# Patient Record
Sex: Female | Born: 1990 | Hispanic: No | Marital: Single | Smoking: Never smoker
Health system: Southern US, Community
[De-identification: ages and names within clinical notes are randomized; demographics above are authoritative.]

## PROBLEM LIST (undated history)

## (undated) DIAGNOSIS — Q780 Osteogenesis imperfecta: Secondary | ICD-10-CM

---

## 2011-07-24 DIAGNOSIS — Z3490 Encounter for supervision of normal pregnancy, unspecified, unspecified trimester: Secondary | ICD-10-CM | POA: Insufficient documentation

## 2011-09-06 DIAGNOSIS — Z3009 Encounter for other general counseling and advice on contraception: Secondary | ICD-10-CM | POA: Insufficient documentation

## 2017-01-06 ENCOUNTER — Emergency Department (HOSPITAL_COMMUNITY): Payer: Commercial Managed Care - HMO

## 2017-01-06 ENCOUNTER — Encounter (HOSPITAL_COMMUNITY): Payer: Self-pay

## 2017-01-06 ENCOUNTER — Emergency Department (HOSPITAL_COMMUNITY)
Admission: EM | Admit: 2017-01-06 | Discharge: 2017-01-06 | Disposition: A | Discharge status: Home / Self Care | Payer: Commercial Managed Care - HMO | Attending: Emergency Medicine | Admitting: Emergency Medicine

## 2017-01-06 DIAGNOSIS — M25511 Pain in right shoulder: Secondary | ICD-10-CM | POA: Insufficient documentation

## 2017-01-06 DIAGNOSIS — R935 Abnormal findings on diagnostic imaging of other abdominal regions, including retroperitoneum: Secondary | ICD-10-CM | POA: Insufficient documentation

## 2017-01-06 DIAGNOSIS — M545 Low back pain: Secondary | ICD-10-CM | POA: Insufficient documentation

## 2017-01-06 DIAGNOSIS — S199XXA Unspecified injury of neck, initial encounter: Secondary | ICD-10-CM | POA: Diagnosis present

## 2017-01-06 DIAGNOSIS — Y939 Activity, unspecified: Secondary | ICD-10-CM | POA: Diagnosis not present

## 2017-01-06 DIAGNOSIS — R930 Abnormal findings on diagnostic imaging of skull and head, not elsewhere classified: Secondary | ICD-10-CM | POA: Insufficient documentation

## 2017-01-06 DIAGNOSIS — R918 Other nonspecific abnormal finding of lung field: Secondary | ICD-10-CM | POA: Insufficient documentation

## 2017-01-06 DIAGNOSIS — S161XXA Strain of muscle, fascia and tendon at neck level, initial encounter: Secondary | ICD-10-CM | POA: Diagnosis not present

## 2017-01-06 DIAGNOSIS — Y92410 Unspecified street and highway as the place of occurrence of the external cause: Secondary | ICD-10-CM | POA: Diagnosis not present

## 2017-01-06 DIAGNOSIS — Y999 Unspecified external cause status: Secondary | ICD-10-CM | POA: Diagnosis not present

## 2017-01-06 DIAGNOSIS — M79601 Pain in right arm: Secondary | ICD-10-CM | POA: Insufficient documentation

## 2017-01-06 DIAGNOSIS — Z79899 Other long term (current) drug therapy: Secondary | ICD-10-CM | POA: Insufficient documentation

## 2017-01-06 DIAGNOSIS — R52 Pain, unspecified: Secondary | ICD-10-CM

## 2017-01-06 HISTORY — DX: Osteogenesis imperfecta: Q78.0

## 2017-01-06 LAB — BASIC METABOLIC PANEL
Anion gap: 10 (ref 5–15)
BUN: 9 mg/dL (ref 6–20)
CALCIUM: 8.5 mg/dL — AB (ref 8.9–10.3)
CHLORIDE: 103 mmol/L (ref 101–111)
CO2: 22 mmol/L (ref 22–32)
CREATININE: 0.69 mg/dL (ref 0.44–1.00)
GFR calc Af Amer: >60 mL/min (ref >60)
GFR calc non Af Amer: >60 mL/min (ref >60)
Glucose, Bld: 103 mg/dL — ABNORMAL HIGH (ref 65–99)
Potassium: 3.6 mmol/L (ref 3.5–5.1)
SODIUM: 135 mmol/L (ref 135–145)

## 2017-01-06 LAB — CBC
HCT: 36.7 % (ref 36.0–46.0)
HEMOGLOBIN: 12.7 g/dL (ref 12.0–15.0)
MCH: 30.8 pg (ref 26.0–34.0)
MCHC: 34.6 g/dL (ref 30.0–36.0)
MCV: 89.1 fL (ref 78.0–100.0)
Platelets: 344 10*3/uL (ref 150–400)
RBC: 4.12 MIL/uL (ref 3.87–5.11)
RDW: 12.9 % (ref 11.5–15.5)
WBC: 10.1 10*3/uL (ref 4.0–10.5)

## 2017-01-06 LAB — I-STAT CHEM 8, ED
BUN: 11 mg/dL (ref 6–20)
Calcium, Ion: 1.02 mmol/L — ABNORMAL LOW (ref 1.15–1.40)
Chloride: 106 mmol/L (ref 101–111)
Creatinine, Ser: 0.6 mg/dL (ref 0.44–1.00)
GLUCOSE: 105 mg/dL — AB (ref 65–99)
HEMATOCRIT: 37 % (ref 36.0–46.0)
Hemoglobin: 12.6 g/dL (ref 12.0–15.0)
Potassium: 6.9 mmol/L (ref 3.5–5.1)
Sodium: 136 mmol/L (ref 135–145)
TCO2: 25 mmol/L (ref 0–100)

## 2017-01-06 LAB — I-STAT BETA HCG BLOOD, ED (MC, WL, AP ONLY)

## 2017-01-06 MED ORDER — NAPROXEN 500 MG PO TABS: 500.0000 mg | ORAL_TABLET | Freq: Two times a day (BID) | ORAL | 0 refills | Status: DC

## 2017-01-06 MED ORDER — IOPAMIDOL (ISOVUE-300) INJECTION 61%
100.0000 mL | Freq: Once | INTRAVENOUS | Status: AC | PRN
Start: 2017-01-06 — End: 2017-01-06
  Administered 2017-01-06: 100 mL via INTRAVENOUS

## 2017-01-06 MED ORDER — IOPAMIDOL (ISOVUE-300) INJECTION 61%
INTRAVENOUS | Status: AC
  Administered 2017-01-06: 100 mL via INTRAVENOUS
  Filled 2017-01-06: qty 100

## 2017-01-06 MED ORDER — HYDROCODONE-ACETAMINOPHEN 5-325 MG PO TABS
2.0000 | ORAL_TABLET | Freq: Once | ORAL | Status: AC
  Administered 2017-01-06: 2 via ORAL
  Filled 2017-01-06: qty 2

## 2017-01-06 MED ORDER — METHOCARBAMOL 500 MG PO TABS: 500.0000 mg | ORAL_TABLET | Freq: Two times a day (BID) | ORAL | 0 refills | Status: DC

## 2017-01-06 MED ORDER — HYDROCODONE-ACETAMINOPHEN 5-325 MG PO TABS: 1.0000 | ORAL_TABLET | ORAL | 0 refills | Status: AC | PRN
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
## 2017-01-06 NOTE — ED Notes (Signed)
Writer notified PA of abnormal I-stat chem 8 result 

## 2017-01-06 NOTE — Discharge Instructions (Signed)
1. Medications: robaxin, naproxyn, vicodin, usual home medications 2. Treatment: rest, drink plenty of fluids, gentle stretching as discussed, alternate ice and heat 3. Follow Up: Please followup with your primary doctor in 3 days for discussion of your diagnoses and further evaluation after today's visit; if you do not have a primary care doctor use the resource guide provided to find one;  Return to the ER for worsening back pain, difficulty walking, loss of bowel or bladder control or other concerning symptoms    

## 2021-01-27 ENCOUNTER — Encounter (HOSPITAL_COMMUNITY): Payer: Self-pay | Admitting: Emergency Medicine

## 2021-01-27 ENCOUNTER — Other Ambulatory Visit: Payer: Self-pay

## 2021-01-27 ENCOUNTER — Emergency Department (HOSPITAL_COMMUNITY)
Admission: EM | Admit: 2021-01-27 | Discharge: 2021-01-27 | Disposition: A | Discharge status: Home / Self Care | Payer: Medicaid - Out of State | Attending: Emergency Medicine | Admitting: Emergency Medicine

## 2021-01-27 ENCOUNTER — Emergency Department (HOSPITAL_COMMUNITY): Payer: Medicaid - Out of State

## 2021-01-27 DIAGNOSIS — Y92511 Restaurant or cafe as the place of occurrence of the external cause: Secondary | ICD-10-CM | POA: Insufficient documentation

## 2021-01-27 DIAGNOSIS — X501XXA Overexertion from prolonged static or awkward postures, initial encounter: Secondary | ICD-10-CM | POA: Insufficient documentation

## 2021-01-27 DIAGNOSIS — S99922A Unspecified injury of left foot, initial encounter: Secondary | ICD-10-CM | POA: Diagnosis present

## 2021-01-27 DIAGNOSIS — Y990 Civilian activity done for income or pay: Secondary | ICD-10-CM | POA: Diagnosis not present

## 2021-01-27 DIAGNOSIS — S93602A Unspecified sprain of left foot, initial encounter: Secondary | ICD-10-CM | POA: Insufficient documentation

## 2021-01-27 NOTE — Discharge Instructions (Signed)
You are seen in the ER today for your foot pain.  Your physical exam and x-rays are very reassuring.  There is no sign of fracture or dislocation on your x-ray.  You were placed in a supportive brace and given crutches.  Below is contact information for Dr. Susa Simmonds, orthopedic provider on-call.  Please call his office to schedule follow-up appointment.  Return to ER if you develop any new numbness, tingling, weakness in your foot, or any other new severe symptoms.

## 2021-01-27 NOTE — ED Triage Notes (Signed)
Patient reports left foot pain, swelling, numbness and tingling x2 months. She states she works at [employer] and stands/walks at work for 10-12 hour shifts/day. She denies previous injury. She has a history of osteogenesis imperfecta. She denies any previous fractures in the left foot.

## 2021-01-27 NOTE — ED Provider Notes (Signed)
[facility]-EMERGENCY DEPT Provider Note   CSN: [id number] Arrival date & time: 01/27/21  1248     History Chief Complaint  Patient presents with  . Foot Pain    Sharon Perkins is a 30 y.o. female with history of osteogenesis imperfecta presents with concern for 2 months of intermittent left foot pain, swelling, and tingling sensations.  Patient states she never had issues with this when she worked at her prior job, recently started working at [employer] and now stands on her feet 10 to 12 hours a day.  She states pain is severe at the end of the day, and exacerbated with weightbearing.  She denies any numbness or weakness in the leg.  Patient was previously managed with orthopedic care in [facility], recently relocated to the area and is desiring to establish care with an orthopedic provider here.  HPI     Past Medical History:  Diagnosis Date  . Osteogenesis imperfecta     There are no problems to display for this patient.   History reviewed. No pertinent surgical history.   OB History   No obstetric history on file.     History reviewed. No pertinent family history.  Social History   Tobacco Use  . Smoking status: Never Smoker  . Smokeless tobacco: Never Used    Home Medications Prior to Admission medications   Medication Sig Start Date End Date Taking? Authorizing Provider  HYDROcodone-acetaminophen (NORCO/VICODIN) 5-325 MG tablet Take 1-2 tablets by mouth every 4 (four) hours as needed. 01/06/17   Muthersbaugh, Dahlia Client, PA-C  methocarbamol (ROBAXIN) 500 MG tablet Take 1 tablet (500 mg total) by mouth 2 (two) times daily. 01/06/17   Muthersbaugh, Dahlia Client, PA-C  naproxen (NAPROSYN) 500 MG tablet Take 1 tablet (500 mg total) by mouth 2 (two) times daily with a meal. 01/06/17   Muthersbaugh, Dahlia Client, PA-C    Allergies    Ampicillin  Review of Systems   Review of Systems  Constitutional: Negative.   HENT: Negative.   Respiratory: Negative.    Cardiovascular: Negative.   Gastrointestinal: Negative.   Genitourinary: Negative.   Musculoskeletal: Positive for arthralgias and joint swelling.  Neurological: Negative.     Physical Exam Updated Vital Signs BP 119/68 (BP Location: Right Arm)   Pulse 81   Temp 98.5 F (36.9 C) (Oral)   Resp 18   LMP 01/02/2021   SpO2 98%   Physical Exam Vitals and nursing note reviewed.  Constitutional:      General: She is not in acute distress.    Appearance: She is obese. She is not toxic-appearing.  HENT:     Head: Normocephalic and atraumatic.     Mouth/Throat:     Mouth: Mucous membranes are moist.     Pharynx: No oropharyngeal exudate or posterior oropharyngeal erythema.  Eyes:     General:        Right eye: No discharge.        Left eye: No discharge.     Extraocular Movements: Extraocular movements intact.     Conjunctiva/sclera: Conjunctivae normal.     Pupils: Pupils are equal, round, and reactive to light.  Cardiovascular:     Rate and Rhythm: Normal rate and regular rhythm.     Pulses: Normal pulses.     Heart sounds: Normal heart sounds. No murmur heard.   Pulmonary:     Effort: Pulmonary effort is normal. No respiratory distress.     Breath sounds: Normal breath sounds. No wheezing or rales.  Abdominal:     General: Bowel sounds are normal. There is no distension.     Palpations: Abdomen is soft.     Tenderness: There is no abdominal tenderness. There is no guarding or rebound.  Musculoskeletal:        General: Swelling and tenderness present. No deformity.     Cervical back: Neck supple.     Right lower leg: Normal. No edema.     Left lower leg: Normal. No edema.     Right ankle: Normal.     Right Achilles Tendon: Normal.     Left ankle: No swelling, deformity or ecchymosis. Tenderness present over the lateral malleolus. Normal range of motion. Anterior drawer test negative. Normal pulse.     Left Achilles Tendon: Normal.     Right foot: Normal.     Left  foot: Normal range of motion and normal capillary refill. Swelling and tenderness present. No deformity, bunion, Charcot foot, laceration, bony tenderness or crepitus. Normal pulse.       Legs:  Skin:    General: Skin is warm and dry.     Capillary Refill: Capillary refill takes less than 2 seconds.  Neurological:     General: No focal deficit present.     Mental Status: She is alert and oriented to person, place, and time. Mental status is at baseline.     Sensory: Sensation is intact.     Motor: Motor function is intact.     Gait: Gait is intact.  Psychiatric:        Mood and Affect: Mood normal.     ED Results / Procedures / Treatments   Labs (all labs ordered are listed, but only abnormal results are displayed) Labs Reviewed - No data to display  EKG None  Radiology DG Ankle Complete Left  Result Date: 01/27/2021 CLINICAL DATA:  Pain and swelling EXAM: LEFT ANKLE COMPLETE - 3+ VIEW COMPARISON:  None. FINDINGS: Frontal, oblique, and lateral views obtained. There is mild generalized soft tissue swelling. There is no appreciable fracture or joint effusion. There is underlying osteoporosis for age. Joint spaces appear normal. No erosive change. Ankle mortise appears intact. IMPRESSION: Osteoporosis. Mild soft tissue swelling. No evident fracture or arthropathy. Ankle mortise appears intact. Electronically Signed   By: Bretta Bang III M.D.   On: 01/27/2021 14:27    Procedures Procedures   Medications Ordered in ED Medications - No data to display  ED Course  I have reviewed the triage vital signs and the nursing notes.  Pertinent labs & imaging results that were available during my care of the patient were reviewed by me and considered in my medical decision making (see chart for details).    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  Differential diagnosis includes is not limited to acute fracture dislocation, ligamentous injury, soft tissue injury, infectious etiology, Planter fasciitis.  Physical exam with mild edema over the dorsum of the Left foot without crepitus, induration, erythema, or other signs of infection.  There is also tenderness palpation over the lateral malleolus without decreased range of motion of the ankle.  Normal sensation and strength is symmetric and relative to the right foot.  Patient is neurovascular intact.  Plain film obtained in  triage revealed no acute fracture or dislocation though there is soft tissue swelling.  Additionally noted osteoporosis, which patient was aware of given her history of OI.  Ankle mortise intact.  No further work-up warranted in the this time.  While the exact field of patient symptoms remains unclear, there is no appear to be emergent issue at this time.  Recommend she follow-up closely with orthopedics; will place patient in supportive brace and give crutches in the meantime.  Sharon Perkins voiced understanding of her medical evaluation and treatment plan.  Each of her questions was answered to her expressed satisfaction.  Return precautions given.  Patient is stable and appropriate for discharge at this time.  This chart was dictated using voice recognition software, Dragon. Despite the best efforts of this provider to proofread and correct errors, errors may still occur which can change documentation meaning.  Final Clinical Impression(s) / ED Diagnoses Final diagnoses:  Sprain of left foot, initial encounter    Rx / DC Orders ED Discharge Orders    None       Sherrilee Gilles 01/27/21 1804    Lorre Nick, MD 02/05/21 1444

## 2021-02-08 ENCOUNTER — Other Ambulatory Visit: Payer: Self-pay

## 2021-02-08 ENCOUNTER — Emergency Department (HOSPITAL_COMMUNITY): Payer: Medicaid - Out of State

## 2021-02-08 ENCOUNTER — Emergency Department (HOSPITAL_COMMUNITY)
Admission: EM | Admit: 2021-02-08 | Discharge: 2021-02-08 | Disposition: A | Discharge status: Home / Self Care | Payer: Medicaid - Out of State | Attending: Emergency Medicine | Admitting: Emergency Medicine

## 2021-02-08 ENCOUNTER — Encounter (HOSPITAL_COMMUNITY): Payer: Self-pay

## 2021-02-08 DIAGNOSIS — F410 Panic disorder [episodic paroxysmal anxiety] without agoraphobia: Secondary | ICD-10-CM | POA: Insufficient documentation

## 2021-02-08 DIAGNOSIS — R0602 Shortness of breath: Secondary | ICD-10-CM | POA: Insufficient documentation

## 2021-02-08 DIAGNOSIS — R55 Syncope and collapse: Secondary | ICD-10-CM | POA: Insufficient documentation

## 2021-02-08 LAB — CBC WITH DIFFERENTIAL/PLATELET
Abs Immature Granulocytes: 0.03 10*3/uL (ref 0.00–0.07)
Basophils Absolute: 0.1 10*3/uL (ref 0.0–0.1)
Basophils Relative: 1 %
Eosinophils Absolute: 0.3 10*3/uL (ref 0.0–0.5)
Eosinophils Relative: 3 %
HCT: 41.9 % (ref 36.0–46.0)
Hemoglobin: 14.2 g/dL (ref 12.0–15.0)
Immature Granulocytes: 0 %
Lymphocytes Relative: 23 %
Lymphs Abs: 2.5 10*3/uL (ref 0.7–4.0)
MCH: 30.2 pg (ref 26.0–34.0)
MCHC: 33.9 g/dL (ref 30.0–36.0)
MCV: 89.1 fL (ref 80.0–100.0)
Monocytes Absolute: 0.8 10*3/uL (ref 0.1–1.0)
Monocytes Relative: 7 %
Neutro Abs: 6.9 10*3/uL (ref 1.7–7.7)
Neutrophils Relative %: 66 %
Platelets: 462 10*3/uL — ABNORMAL HIGH (ref 150–400)
RBC: 4.7 MIL/uL (ref 3.87–5.11)
RDW: 12.3 % (ref 11.5–15.5)
WBC: 10.5 10*3/uL (ref 4.0–10.5)
nRBC: 0 % (ref 0.0–0.2)

## 2021-02-08 LAB — BASIC METABOLIC PANEL
Anion gap: 8 (ref 5–15)
BUN: 8 mg/dL (ref 6–20)
CO2: 23 mmol/L (ref 22–32)
Calcium: 8.8 mg/dL — ABNORMAL LOW (ref 8.9–10.3)
Chloride: 105 mmol/L (ref 98–111)
Creatinine, Ser: 0.76 mg/dL (ref 0.44–1.00)
GFR, Estimated: >60 mL/min (ref >60)
Glucose, Bld: 83 mg/dL (ref 70–99)
Potassium: 4.4 mmol/L (ref 3.5–5.1)
Sodium: 136 mmol/L (ref 135–145)

## 2021-02-08 LAB — TROPONIN I (HIGH SENSITIVITY): Troponin I (High Sensitivity): 2 ng/L (ref <18)

## 2021-02-08 MED ORDER — MECLIZINE HCL 25 MG PO TABS
25.0000 mg | ORAL_TABLET | Freq: Once | ORAL | Status: AC
  Administered 2021-02-08: 25 mg via ORAL
  Filled 2021-02-08: qty 1

## 2021-02-08 MED ORDER — MECLIZINE HCL 25 MG PO TABS: 25.0000 mg | ORAL_TABLET | Freq: Two times a day (BID) | ORAL | 0 refills | Status: DC | PRN

## 2021-02-08 MED ORDER — MECLIZINE HCL 25 MG PO TABS: 25.0000 mg | ORAL_TABLET | Freq: Two times a day (BID) | ORAL | 0 refills | Status: AC | PRN

## 2021-02-08 MED ORDER — SODIUM CHLORIDE 0.9 % IV BOLUS
500.0000 mL | Freq: Once | INTRAVENOUS | Status: AC
  Administered 2021-02-08: 500 mL via INTRAVENOUS

## 2021-02-08 NOTE — ED Notes (Signed)
Pt ambulated around room and SpO2 stayed around 97% on RA. Pt reports feeling dizzy, but is not unbalanced when walking.

## 2021-02-08 NOTE — ED Provider Notes (Signed)
[facility]-EMERGENCY DEPT Provider Note   CSN: [id number] Arrival date & time: 02/08/21  1301     History Chief Complaint  Patient presents with   Near Syncope    Anxiety    Sharon Perkins is a 30 y.o. female.  HPI  Patient with significant medical history of osteogenesis imperfecta presents to the emergency department chief complaint of syncopal episode.  Patient comes via EMS, patient states she was at work today and was walking and started to feel off balance from her left foot, which triggered her to have a panic attack.  Patient states her left foot has been hurting her for the last couple months, states this is from her osteogenesis, she has been worked up in the past for this and is benign.  States that she generally feels off balance due to her foot.  Patient states she had shortness of breath and felt very dizzy during her panic attack, she denies losing conscious, denies hitting her head, is not on anticoagulant.  Patient states she typically has pain attacks but does not have shortness of breath and feeling lightheaded, she denies feeling nauseous, vomiting, denies change in vision, paresthesias or weakness in the upper or lower extremities.  She denies fevers, chills, states that the shortness of breath has since resolved but she currently feels lightheaded.  She states it is worse when she goes from a sitting to standing position, describes it as the room is spinning.  Patient denies any alleviating factors.  Past Medical History:  Diagnosis Date   Osteogenesis imperfecta     There are no problems to display for this patient.   History reviewed. No pertinent surgical history.   OB History   No obstetric history on file.     History reviewed. No pertinent family history.  Social History   Tobacco Use   Smoking status: Never   Smokeless tobacco: Never    Home Medications Prior to Admission medications   Medication Sig Start Date End Date  Taking? Authorizing Provider  meclizine (ANTIVERT) 25 MG tablet Take 1 tablet (25 mg total) by mouth 2 (two) times daily as needed for dizziness. 02/08/21 04/09/21 Yes Carroll Sage, PA-C  HYDROcodone-acetaminophen (NORCO/VICODIN) 5-325 MG tablet Take 1-2 tablets by mouth every 4 (four) hours as needed. 01/06/17   Muthersbaugh, Dahlia Client, PA-C  methocarbamol (ROBAXIN) 500 MG tablet Take 1 tablet (500 mg total) by mouth 2 (two) times daily. 01/06/17   Muthersbaugh, Dahlia Client, PA-C  naproxen (NAPROSYN) 500 MG tablet Take 1 tablet (500 mg total) by mouth 2 (two) times daily with a meal. 01/06/17   Muthersbaugh, Dahlia Client, PA-C    Allergies    Ampicillin  Review of Systems   Review of Systems  Constitutional:  Negative for chills and fever.  HENT:  Negative for congestion.   Eyes:  Negative for visual disturbance.  Respiratory:  Positive for shortness of breath.   Cardiovascular:  Negative for chest pain.  Gastrointestinal:  Negative for abdominal pain, diarrhea, nausea and vomiting.  Genitourinary:  Negative for enuresis.  Musculoskeletal:  Negative for back pain.  Skin:  Negative for rash.  Neurological:  Positive for dizziness.  Hematological:  Does not bruise/bleed easily.   Physical Exam Updated Vital Signs BP (!) 129/95   Pulse 78   Temp 98.9 F (37.2 C) (Oral)   Resp 18   LMP 02/01/2021   SpO2 99%   Physical Exam Vitals and nursing note reviewed.  Constitutional:  General: She is not in acute distress.    Appearance: She is not ill-appearing.  HENT:     Head: Normocephalic and atraumatic.     Nose: No congestion.  Eyes:     Extraocular Movements: Extraocular movements intact.     Conjunctiva/sclera: Conjunctivae normal.  Cardiovascular:     Rate and Rhythm: Normal rate and regular rhythm.     Pulses: Normal pulses.     Heart sounds: No murmur heard.   No friction rub. No gallop.  Pulmonary:     Effort: No respiratory distress.     Breath sounds: No wheezing, rhonchi  or rales.  Abdominal:     Palpations: Abdomen is soft.     Tenderness: There is no abdominal tenderness.  Musculoskeletal:     Cervical back: No rigidity.     Right lower leg: No edema.     Left lower leg: No edema.     Comments: Patient has 5 of 5 strength, neurovascular fully intact.   Skin:    General: Skin is warm and dry.  Neurological:     Mental Status: She is alert.     GCS: GCS eye subscore is 4. GCS verbal subscore is 5. GCS motor subscore is 6.     Sensory: Sensation is intact.     Motor: No weakness.     Coordination: Romberg sign negative. Finger-Nose-Finger Test normal.     Comments: Cranial nerves II through XII are grossly intact  Patient have no difficulty with word finding.  Psychiatric:        Mood and Affect: Mood normal.    ED Results / Procedures / Treatments   Labs (all labs ordered are listed, but only abnormal results are displayed) Labs Reviewed  BASIC METABOLIC PANEL - Abnormal; Notable for the following components:      Result Value   Calcium 8.8 (*)    All other components within normal limits  CBC WITH DIFFERENTIAL/PLATELET - Abnormal; Notable for the following components:   Platelets 462 (*)    All other components within normal limits  TROPONIN I (HIGH SENSITIVITY)    EKG None  Radiology DG Chest Port 1 View  Result Date: 02/08/2021 CLINICAL DATA:  Shortness of breath.  Near syncope. EXAM: PORTABLE CHEST 1 VIEW COMPARISON:  None. FINDINGS: The heart size and mediastinal contours are within normal limits. Both lungs are clear. The visualized skeletal structures are unremarkable. IMPRESSION: No active disease. Electronically Signed   By: Paulina Fusi M.D.   On: 02/08/2021 14:22    Procedures Procedures   Medications Ordered in ED Medications  meclizine (ANTIVERT) tablet 25 mg (25 mg Oral Given 02/08/21 1525)  sodium chloride 0.9 % bolus 500 mL (500 mLs Intravenous New Bag/Given (Non-Interop) 02/08/21 1525)    ED Course  I have  reviewed the triage vital signs and the nursing notes.  Pertinent labs & imaging results that were available during my care of the patient were reviewed by me and considered in my medical decision making (see chart for details).    MDM Rules/Calculators/A&P                         Initial impression-patient presents with near syncopal episode.  She is alert, does not appear to be in acute distress, vital signs reassuring.  Suspect vasovagal syncope, will obtain basic lab work-up, provide patient fluids, meclizine and reassess.  Work-up-CBC unremarkable, BMP unremarkable, first troponin is 2.  Chest x-ray  negative for acute findings.  EKG sinus rhythm without signs of ischemia  Reassessment-patient is orthostatic positive will provide patient fluids and reassess.  Patient is reassessed, after fluids as well as meclizine, states she is feeling much better, has complaints this time.  Updated her lab or imaging, patient is agreement for discharge.  Rule out- low suspicion for CVA or intracranial head bleed as patient denies change in vision, paresthesias or weakness in the upper or lower extremities, no neuro deficits noted on exam, will defer CT imaging as patient denies hitting her head, lose of conscious. suspect this is more vasovagal syncope.  Low suspicion for systemic infection and/or meningitis as there is no meningeal present on exam, vital signs reassuring, no leukocytosis seen on CBC.  Low suspicion for ACS or arrhythmias as patient denies chest pain, shortness of breath,  EKG sinus without signs of ischemia first troponin is 2 will defer second troponin as patient has been chest pain-free for greater than 12 hours, would expect elevation in troponin if ACS was present..  Low suspicion for systemic infection as patient is nontoxic-appearing, vital signs reassuring, no obvious source infection noted on exam.     Plan-  Dizziness since resolved-suspect patient suffering from vasovagal or  possible vertigo.  Will provide patient with meclizine, remind her to stay hydrated, follow-up PCP for further evaluation.  Vital signs have remained stable, no indication for hospital admission.  Patient given at home care as well strict return precautions.  Patient verbalized that they understood agreed to said plan.  Final Clinical Impression(s) / ED Diagnoses Final diagnoses:  Near syncope    Rx / DC Orders ED Discharge Orders          Ordered    meclizine (ANTIVERT) 25 MG tablet  2 times daily PRN        02/08/21 1559             Carroll Sage, PA-C 02/08/21 1603    Lorre Nick, MD 02/08/21 2246

## 2021-02-08 NOTE — ED Triage Notes (Signed)
Pt BIB EMS from work. Pt reports getting up and feeling weak and faint. Pt had no LOC. Pt had anxiety attack after this episode. She has history of osteogenesis imperfecta.

## 2021-02-08 NOTE — Discharge Instructions (Signed)
Lab work and imaging are reassuring.  Suspect you are suffering from dehydration and vertigo.  Starting you on meclizine please take as needed for dizziness.  Also want you to stay hydrated as this can help prevent  dizziness as well, please remember to eat frequently and small snacks.  Please follow-up your PCP as needed for further evaluation.  Come back to the emergency department if you develop chest pain, shortness of breath, severe abdominal pain, uncontrolled nausea, vomiting, diarrhea.

## 2021-02-13 ENCOUNTER — Encounter (HOSPITAL_COMMUNITY): Payer: Self-pay

## 2021-02-13 ENCOUNTER — Emergency Department (HOSPITAL_COMMUNITY)
Admission: EM | Admit: 2021-02-13 | Discharge: 2021-02-13 | Disposition: A | Discharge status: Home / Self Care | Payer: Medicaid - Out of State | Attending: Emergency Medicine | Admitting: Emergency Medicine

## 2021-02-13 ENCOUNTER — Other Ambulatory Visit: Payer: Self-pay

## 2021-02-13 DIAGNOSIS — H8110 Benign paroxysmal vertigo, unspecified ear: Secondary | ICD-10-CM | POA: Diagnosis not present

## 2021-02-13 DIAGNOSIS — R42 Dizziness and giddiness: Secondary | ICD-10-CM | POA: Diagnosis present

## 2021-02-13 LAB — CBC WITH DIFFERENTIAL/PLATELET
Abs Immature Granulocytes: 0.01 10*3/uL (ref 0.00–0.07)
Basophils Absolute: 0.1 10*3/uL (ref 0.0–0.1)
Basophils Relative: 1 %
Eosinophils Absolute: 0.3 10*3/uL (ref 0.0–0.5)
Eosinophils Relative: 4 %
HCT: 42.4 % (ref 36.0–46.0)
Hemoglobin: 14.4 g/dL (ref 12.0–15.0)
Immature Granulocytes: 0 %
Lymphocytes Relative: 30 %
Lymphs Abs: 2.5 10*3/uL (ref 0.7–4.0)
MCH: 30.6 pg (ref 26.0–34.0)
MCHC: 34 g/dL (ref 30.0–36.0)
MCV: 90.2 fL (ref 80.0–100.0)
Monocytes Absolute: 0.5 10*3/uL (ref 0.1–1.0)
Monocytes Relative: 7 %
Neutro Abs: 4.9 10*3/uL (ref 1.7–7.7)
Neutrophils Relative %: 58 %
Platelets: ADEQUATE 10*3/uL (ref 150–400)
RBC: 4.7 MIL/uL (ref 3.87–5.11)
RDW: 12.2 % (ref 11.5–15.5)
WBC: 8.3 10*3/uL (ref 4.0–10.5)
nRBC: 0 % (ref 0.0–0.2)

## 2021-02-13 LAB — BASIC METABOLIC PANEL
Anion gap: 9 (ref 5–15)
BUN: 7 mg/dL (ref 6–20)
CO2: 22 mmol/L (ref 22–32)
Calcium: 8.8 mg/dL — ABNORMAL LOW (ref 8.9–10.3)
Chloride: 105 mmol/L (ref 98–111)
Creatinine, Ser: 0.76 mg/dL (ref 0.44–1.00)
GFR, Estimated: >60 mL/min (ref >60)
Glucose, Bld: 80 mg/dL (ref 70–99)
Potassium: 4.3 mmol/L (ref 3.5–5.1)
Sodium: 136 mmol/L (ref 135–145)

## 2021-02-13 NOTE — ED Notes (Signed)
Pt states she felt dizzy and light headed during ortho vitals

## 2021-02-13 NOTE — ED Provider Notes (Signed)
Sharon Perkins Provider Note   CSN: [id number] Arrival date & time: 02/13/21  1218     History Chief Complaint  Patient presents with   Dizziness    Sharon Perkins is a 30 y.o. female with past medical history of osteogenesis imperfecta who presents to the ED for severe dizziness.  I reviewed patient's medical record and he she was just evaluated in the ER on 02/08/2021 for same complaints.  At that time, she had presented by EMS for panic attack in setting of room spinning dizziness.  Her physical exam was largely benign, neurologically intact.  Chest x-ray and ACS work-up was negative.  CT imaging of head was deferred.  Patient's symptoms have improved with meclizine and she was discharged home with instructions to follow-up with primary care provider.  On my examination, patient reports that she never felt significant improved after last ED encounter.  She went home and continue to experience constant head "fuzziness and lightheadedness" and then severe worsening dizziness that is intermittent.  She states that whenever she goes out into open spaces or goes to [place] place of [place] the airport, she will intermittently experience room spinning and for moving dizziness that causes her to grab onto fixed objects to avoid falling over.  She states that when those episodes occur, she then develops associated palpitations and symptoms of near syncope.  She adamantly denies any palpitations, cardiac symptoms, visual changes, nausea, or other prodromal symptoms prior to onset of the room spinning dizziness.  She denies any recent illness or infection, fevers or chills, hearing loss, visual changes, numbness or weakness, inability to move extremities, numbness, chest pain, abdominal pain, or other symptoms.  HPI     Past Medical History:  Diagnosis Date   Osteogenesis imperfecta     There are no problems to display for this patient.   History reviewed.  No pertinent surgical history.   OB History   No obstetric history on file.     No family history on file.  Social History   Tobacco Use   Smoking status: Never   Smokeless tobacco: Never  Substance Use Topics   Alcohol use: Not Currently   Drug use: Not Currently    Home Medications Prior to Admission medications   Medication Sig Start Date End Date Taking? Authorizing Provider  HYDROcodone-acetaminophen (NORCO/VICODIN) 5-325 MG tablet Take 1-2 tablets by mouth every 4 (four) hours as needed. 01/06/17   Muthersbaugh, Dahlia Client, PA-C  meclizine (ANTIVERT) 25 MG tablet Take 1 tablet (25 mg total) by mouth 2 (two) times daily as needed for dizziness. 02/08/21 04/09/21  Carroll Sage, PA-C  methocarbamol (ROBAXIN) 500 MG tablet Take 1 tablet (500 mg total) by mouth 2 (two) times daily. 01/06/17   Muthersbaugh, Dahlia Client, PA-C  naproxen (NAPROSYN) 500 MG tablet Take 1 tablet (500 mg total) by mouth 2 (two) times daily with a meal. 01/06/17   Muthersbaugh, Dahlia Client, PA-C    Allergies    Ampicillin  Review of Systems   Review of Systems  All other systems reviewed and are negative.  Physical Exam Updated Vital Signs BP (!) 102/91   Pulse 77   Temp 98 F (36.7 C) (Oral)   Resp 16   Ht 5\' 5"  (1.651 m)   Wt 81.6 kg   LMP 02/01/2021   SpO2 100%   BMI 29.95 kg/m   Physical Exam Vitals and nursing note reviewed. Exam conducted with a chaperone present.  Constitutional:  General: She is not in acute distress.    Appearance: She is not toxic-appearing.  HENT:     Head: Normocephalic and atraumatic.  Eyes:     General: No scleral icterus.    Conjunctiva/sclera: Conjunctivae normal.  Cardiovascular:     Rate and Rhythm: Normal rate and regular rhythm.     Pulses: Normal pulses.     Heart sounds: Normal heart sounds.  Pulmonary:     Effort: Pulmonary effort is normal. No respiratory distress.     Breath sounds: Normal breath sounds.  Skin:    General: Skin is dry.   Neurological:     General: No focal deficit present.     Mental Status: She is alert and oriented to person, place, and time.     GCS: GCS eye subscore is 4. GCS verbal subscore is 5. GCS motor subscore is 6.     Cranial Nerves: No cranial nerve deficit.     Sensory: No sensory deficit.     Motor: No weakness.     Comments: CN II through XII grossly intact.  Moves all extremities with good strength intact against resistance.  Sensation intact and symmetric throughout.  Negative finger-to-nose/cerebellar exam.  Mild sway with standing Romberg, but without arm drift.  Positive Dix-Hallpike exam.  Positive head impulse exam.  Psychiatric:        Mood and Affect: Mood normal.        Behavior: Behavior normal.        Thought Content: Thought content normal.    ED Results / Procedures / Treatments   Labs (all labs ordered are listed, but only abnormal results are displayed) Labs Reviewed  BASIC METABOLIC PANEL - Abnormal; Notable for the following components:      Result Value   Calcium 8.8 (*)    All other components within normal limits  CBC WITH DIFFERENTIAL/PLATELET    EKG None  Radiology No results found.  Procedures Procedures   Medications Ordered in ED Medications - No data to display  ED Course  I have reviewed the triage vital signs and the nursing notes.  Pertinent labs & imaging results that were available during my care of the patient were reviewed by me and considered in my medical decision making (see chart for details).    MDM Rules/Calculators/A&P                          Sharon Perkins was evaluated in Emergency Department on 02/13/2021 for the symptoms described in the history of present illness. She was evaluated in the context of the global COVID-19 pandemic, which necessitated consideration that the patient might be at risk for infection with the SARS-CoV-2 virus that causes COVID-19. Institutional protocols and algorithms that pertain to the evaluation of  patients at risk for COVID-19 are in a state of rapid change based on information released by regulatory bodies including the CDC and federal and state organizations. These policies and algorithms were followed during the patient's care in the ED.  I personally reviewed patient's medical chart and all notes from triage and staff during today's encounter. I have also ordered and reviewed all labs and imaging that I felt to be medically necessary in the evaluation of this patient's complaints and with consideration of their physical exam. If needed, translation services were available and utilized.  She has been charted as being hypotensive in triage, but good vital signs during my examination.  Positive orthostatics.  There is no drop in her systolic, in fact increases 2 mmHg when changing from lying to standing position.  However, there is also an associated 35 beat per minute increase in heart rate suggestive of mild orthostatic hypotension.  Will encourage increased oral hydration.  She did not have any associated room spinning dizziness or lightheadedness on standing from seated position on my exam.  She has not had any falls at home.  No syncope.  Basic laboratory work-up obtained is reviewed and entirely unremarkable.  Benign paroxysmal positional vertigo is quite common in adult patients with osteogenesis imperfecta.  I suspect the patient will require referral to ENT for vestibular rehabilitation.  I discussed plan with patient and mother who is now at bedside.  They agree that MRI not warranted at this time.  Likely BPPV.  Will refer to ENT.  Also encouraged oral antihistamines and Flonase to help with possible ET dysfunction as contributing factor.  ER return precautions discussed.  Patient and mother bedside voiced understanding and are agreeable to the plan  Final Clinical Impression(s) / ED Diagnoses Final diagnoses:  Benign paroxysmal positional vertigo, unspecified laterality    Rx /  DC Orders ED Discharge Orders          Ordered    Ambulatory referral to ENT        02/13/21 1551             Elvera Maria 02/13/21 1551    Gerhard Munch, MD 02/13/21 1615

## 2021-02-13 NOTE — Discharge Instructions (Addendum)
Your work-up today is reassuring.  Your history and physical exam is suggestive of benign paroxysmal positional vertigo.  Please read the attachment.  Have also provided an attachment on how to perform an Epley maneuver.  You will likely require vestibular rehabilitation which is why I have placed a referral to ear nose and throat specialist.  In the interim, I would like for her to continue taking meclizine as needed.  I would also like for you to start daily oral antihistamine such as over-the-counter Zyrtec as well as intranasal Flonase to help with possible eustachian tube dysfunction which could also be contributing to your symptoms.  Please increase your water intake.  Be careful standing up from seated position too quickly.  Return to the ER seek immediate medical attention should you experience any new or worsening symptoms.

## 2021-02-13 NOTE — ED Notes (Signed)
Pt given work note by provider

## 2021-02-13 NOTE — ED Triage Notes (Signed)
Pt c/o dizziness for a few weeks, states it "feels like the ground is moving and I'm stuck". Pt denies falls, n/v or hx of vertigo. Triage RN states pt bp was in 70s, pt current bp is 127/90. Pt A&O X4, seen on 6/16 for same and anxiety and given meclizine- states it is not helping

## 2021-04-17 ENCOUNTER — Emergency Department (HOSPITAL_COMMUNITY)
Admission: EM | Admit: 2021-04-17 | Discharge: 2021-04-17 | Disposition: A | Discharge status: Home / Self Care | Payer: Medicaid - Out of State | Attending: Emergency Medicine | Admitting: Emergency Medicine

## 2021-04-17 ENCOUNTER — Other Ambulatory Visit: Payer: Self-pay

## 2021-04-17 ENCOUNTER — Emergency Department (HOSPITAL_COMMUNITY): Payer: Medicaid - Out of State

## 2021-04-17 DIAGNOSIS — M62838 Other muscle spasm: Secondary | ICD-10-CM | POA: Diagnosis not present

## 2021-04-17 DIAGNOSIS — M25511 Pain in right shoulder: Secondary | ICD-10-CM | POA: Diagnosis present

## 2021-04-17 MED ORDER — NAPROXEN 500 MG PO TABS: 500.0000 mg | ORAL_TABLET | Freq: Two times a day (BID) | ORAL | 0 refills | Status: AC

## 2021-04-17 MED ORDER — NAPROXEN 500 MG PO TABS
500.0000 mg | ORAL_TABLET | Freq: Once | ORAL | Status: AC
  Administered 2021-04-17: 500 mg via ORAL
  Filled 2021-04-17: qty 1

## 2021-04-17 MED ORDER — METHOCARBAMOL 500 MG PO TABS: 500.0000 mg | ORAL_TABLET | Freq: Two times a day (BID) | ORAL | 0 refills | Status: AC

## 2021-04-17 MED ORDER — METHOCARBAMOL 500 MG PO TABS
500.0000 mg | ORAL_TABLET | Freq: Once | ORAL | Status: AC
  Administered 2021-04-17: 500 mg via ORAL
  Filled 2021-04-17: qty 1
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
## 2021-04-17 NOTE — Discharge Instructions (Addendum)
Stretching exercise and warm compresses  recommended. You have large spasms in the neck, shoulder region.  Return to the ER if the symptoms get worse.

## 2021-12-16 ENCOUNTER — Emergency Department (HOSPITAL_BASED_OUTPATIENT_CLINIC_OR_DEPARTMENT_OTHER)
Admission: EM | Admit: 2021-12-16 | Discharge: 2021-12-16 | Disposition: A | Discharge status: Home / Self Care | Payer: Self-pay | Attending: Emergency Medicine | Admitting: Emergency Medicine

## 2021-12-16 ENCOUNTER — Other Ambulatory Visit: Payer: Self-pay

## 2021-12-16 ENCOUNTER — Encounter (HOSPITAL_BASED_OUTPATIENT_CLINIC_OR_DEPARTMENT_OTHER): Payer: Self-pay | Admitting: Emergency Medicine

## 2021-12-16 ENCOUNTER — Emergency Department (HOSPITAL_BASED_OUTPATIENT_CLINIC_OR_DEPARTMENT_OTHER): Payer: Self-pay

## 2021-12-16 DIAGNOSIS — M19079 Primary osteoarthritis, unspecified ankle and foot: Secondary | ICD-10-CM

## 2021-12-16 DIAGNOSIS — M19072 Primary osteoarthritis, left ankle and foot: Secondary | ICD-10-CM | POA: Insufficient documentation

## 2021-12-16 MED ORDER — IBUPROFEN 600 MG PO TABS: 600.0000 mg | ORAL_TABLET | Freq: Four times a day (QID) | ORAL | 0 refills | Status: AC | PRN

## 2021-12-16 MED ORDER — ACETAMINOPHEN 325 MG PO TABS: 650.0000 mg | ORAL_TABLET | Freq: Four times a day (QID) | ORAL | 0 refills | Status: AC | PRN

## 2021-12-16 NOTE — Discharge Instructions (Addendum)
It was a pleasure caring for you today in the emergency department. ° °Please return to the emergency department for any worsening or worrisome symptoms. ° ° °

## 2021-12-16 NOTE — ED Triage Notes (Signed)
Pt 's left heel has been hurting for about 5 months and she feels like it is making her vertigo worse and she is dizzy at times.  ?

## 2021-12-16 NOTE — ED Provider Notes (Addendum)
?[facility] EMERGENCY DEPT ?Provider Note ? ? ?CSN: [id number] ?Arrival date & time: 12/16/21  1025 ? ?  ? ?History ? ?Chief Complaint  ?Patient presents with  ? Foot Pain  ? ? ?Sharon Perkins is a 31 y.o. female. ? ? Patient as above with significant medical history as below, including OI who presents to the ED with complaint of left ankle/heel pain.  ? ?Patient reports around 6 months ago she had an injury to her left heel, left ankle.  Has been having intermittent discomfort since then.  Pain primarily left ankle lateral aspect, heel.  No knee pain or hip pain.  She uses a walker at baseline.  No recent gait disturbance.  She feels that the pain is worse at the end of the day when she has been on her feet for extended period time.  Pain is aching, sharp at times.  No further trauma reported.  No medications prior to arrival.  Does not have a PCP locally. ? ? 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 ?History reviewed. No pertinent surgical history.  ? ? ?The history is provided by the patient. No language interpreter was used.  ?Foot Pain ?Pertinent negatives include no chest pain, no abdominal pain, no headaches and no shortness of breath.  ? ?  ? ?Home Medications ?Prior to Admission medications   ?Medication Sig Start Date End Date Taking? Authorizing Provider  ?HYDROcodone-acetaminophen (NORCO/VICODIN) 5-325 MG tablet Take 1-2 tablets by mouth every 4 (four) hours as needed. 01/06/17   Muthersbaugh, Dahlia ClientHannah, PA-C  ?methocarbamol (ROBAXIN) 500 MG tablet Take 1 tablet (500 mg total) by mouth 2 (two) times daily. 04/17/21   Derwood KaplanNanavati, Ankit, MD  ?naproxen (NAPROSYN) 500 MG tablet Take 1 tablet (500 mg total) by mouth 2 (two) times daily. 04/17/21   Derwood KaplanNanavati, Ankit, MD  ?   ? ?Allergies    ?Ampicillin   ? ?Review of Systems   ?Review of Systems  ?Constitutional:  Negative for chills and fever.  ?HENT:  Negative for facial swelling and trouble swallowing.   ?Eyes:  Negative for photophobia  and visual disturbance.  ?Respiratory:  Negative for cough and shortness of breath.   ?Cardiovascular:  Negative for chest pain and palpitations.  ?Gastrointestinal:  Negative for abdominal pain, nausea and vomiting.  ?Endocrine: Negative for polydipsia and polyuria.  ?Genitourinary:  Negative for difficulty urinating and hematuria.  ?Musculoskeletal:  Positive for arthralgias. Negative for gait problem and joint swelling.  ?Skin:  Negative for pallor and rash.  ?Neurological:  Negative for syncope and headaches.  ?Psychiatric/Behavioral:  Negative for agitation and confusion.   ? ?Physical Exam ?Updated Vital Signs ?BP (!) 136/103   Pulse 72   Temp 98.3 ?F (36.8 ?C) (Oral)   Resp 20   SpO2 100%  ?Physical Exam ?Vitals and nursing note reviewed.  ?Constitutional:   ?   General: She is not in acute distress. ?   Appearance: Normal appearance.  ?HENT:  ?   Head: Normocephalic and atraumatic.  ?   Right Ear: External ear normal.  ?   Left Ear: External ear normal.  ?   Nose: Nose normal.  ?   Mouth/Throat:  ?   Mouth: Mucous membranes are moist.  ?Eyes:  ?   General: No scleral icterus.    ?   Right eye: No discharge.     ?   Left eye: No discharge.  ?Cardiovascular:  ?   Rate and Rhythm: Normal rate and regular rhythm.  ?  Pulses: Normal pulses.  ?   Heart sounds: Normal heart sounds.  ?Pulmonary:  ?   Effort: Pulmonary effort is normal. No respiratory distress.  ?   Breath sounds: Normal breath sounds.  ?Abdominal:  ?   General: Abdomen is flat.  ?   Tenderness: There is no abdominal tenderness.  ?Musculoskeletal:     ?   General: Normal range of motion.  ?   Cervical back: Normal range of motion.  ?   Right lower leg: No edema.  ?   Left lower leg: No edema.  ?Skin: ?   General: Skin is warm and dry.  ?   Capillary Refill: Capillary refill takes less than 2 seconds.  ?Neurological:  ?   Mental Status: She is alert.  ?Psychiatric:     ?   Mood and Affect: Mood normal.     ?   Behavior: Behavior normal.  ? ? ?ED  Results / Procedures / Treatments   ?Labs ?(all labs ordered are listed, but only abnormal results are displayed) ?Labs Reviewed - No data to display ? ?EKG ?None ? ?Radiology ?DG Foot Complete Left ? ?Result Date: 12/16/2021 ?CLINICAL DATA:  Left heel pain for 5 months. EXAM: LEFT FOOT - COMPLETE 3+ VIEW COMPARISON:  Left ankle radiographs 01/27/2021 FINDINGS: Mildly decreased bone mineralization. Minimal great toe metatarsophalangeal joint space narrowing. Moderate medial and dorsal talonavicular joint space narrowing and peripheral degenerative spurring, similar to prior. No acute fracture or dislocation. IMPRESSION: Moderate medial talonavicular osteoarthritis.  No acute fracture. Electronically Signed   By: Neita Garnet M.D.   On: 12/16/2021 11:52   ? ?Procedures ?Procedures  ? ? ?Medications Ordered in ED ?Medications - No data to display ? ?ED Course/ Medical Decision Making/ A&P ?Clinical Course as of 12/16/21 1302  ?Sun Dec 16, 2021  ?1256 Moderate medial talonavicular osteoarthritis [SG]  ?  ?Clinical Course User Index ?[SG] Tanda Rockers A, DO  ? ?                        ?Medical Decision Making ?Amount and/or Complexity of Data Reviewed ?Radiology: ordered. ? ?Risk ?OTC drugs. ?Prescription drug management. ? ? ?This patient presents to the ED for concern of foot/heel pain, this involves an extensive number of treatment options, and is a complaint that carries with it a high risk of complications and morbidity.  The differential diagnosis includes but is not limited to fracture, sprain, strain, MSK. Other serious etiology was considered. ? ?MDM:   ? ?Well-appearing 31 year old female history of osteogenesis imperfecta sent to the ED with left heel pain x6 months ? ?Physical exam is reassuring. ? ?X-ray demonstrates talonavicular osteoarthritis, no fracture ? ?Patient is ambulatory at her typical level. ? ?We will start NSAIDs, Ace wrap.  Advised patient follow-up with PCP.  ? ?The patient improved  significantly and was discharged in stable condition. Detailed discussions were had with the patient regarding current findings, and need for close f/u with PCP or on call doctor. The patient has been instructed to return immediately if the symptoms worsen in any way for re-evaluation. Patient verbalized understanding and is in agreement with current care plan. All questions answered prior to discharge. ? ? ?(Labs, imaging) ? ?Labs: ?I Ordered, and personally interpreted labs.  The pertinent results include: N/A ? ?Imaging Studies ordered: ?I ordered imaging studies including complete foot ?I independently visualized and interpreted imaging. ?I agree with the radiologist interpretation ? ?Additional history obtained from  family.  External records from outside source obtained and reviewed including prior ED visits.  Prior labs and imaging.  PDMP ? ?Critical Interventions: ?n/a ? ?Consultations: ?I requested consultation with the n/a,  and discussed lab and imaging findings as well as pertinent plan - they recommend: n/a ? ?Cardiac Monitoring: ?The patient was maintained on a cardiac monitor.  I personally viewed and interpreted the cardiac monitored which showed an underlying rhythm of: n/a ? ?Reevaluation: ?After the interventions noted above, I reevaluated the patient and found that they have :improved ? ? ?Considered admission for:  foot pain ? ?Social Determinants of Health: ?OI, no PCP ?Social History  ? ?Socioeconomic History  ? Marital status: Single  ?  Spouse name: Not on file  ? Number of children: Not on file  ? Years of education: Not on file  ? Highest education level: Not on file  ?Occupational History  ? Not on file  ?Tobacco Use  ? Smoking status: Never  ? Smokeless tobacco: Never  ?Substance and Sexual Activity  ? Alcohol use: Not Currently  ? Drug use: Not Currently  ? Sexual activity: Not on file  ?Other Topics Concern  ? Not on file  ?Social History Narrative  ? Not on file  ? ?Social Determinants  of Health  ? ?Financial Resource Strain: Not on file  ?Food Insecurity: Not on file  ?Transportation Needs: Not on file  ?Physical Activity: Not on file  ?Stress: Not on file  ?Social Connections: Not on file  ?

## 2021-12-16 NOTE — ED Notes (Signed)
Discharge paperwork given and understood. 

## 2022-05-24 IMAGING — DX DG FOOT COMPLETE 3+V*L*
3 series · 3 of 3 positions shown · non-contrast
Comparison: Left ankle radiographs 01/27/2021

CLINICAL DATA: Left heel pain for 5 months.

EXAM:
LEFT FOOT - COMPLETE 3+ VIEW

[foot ap]
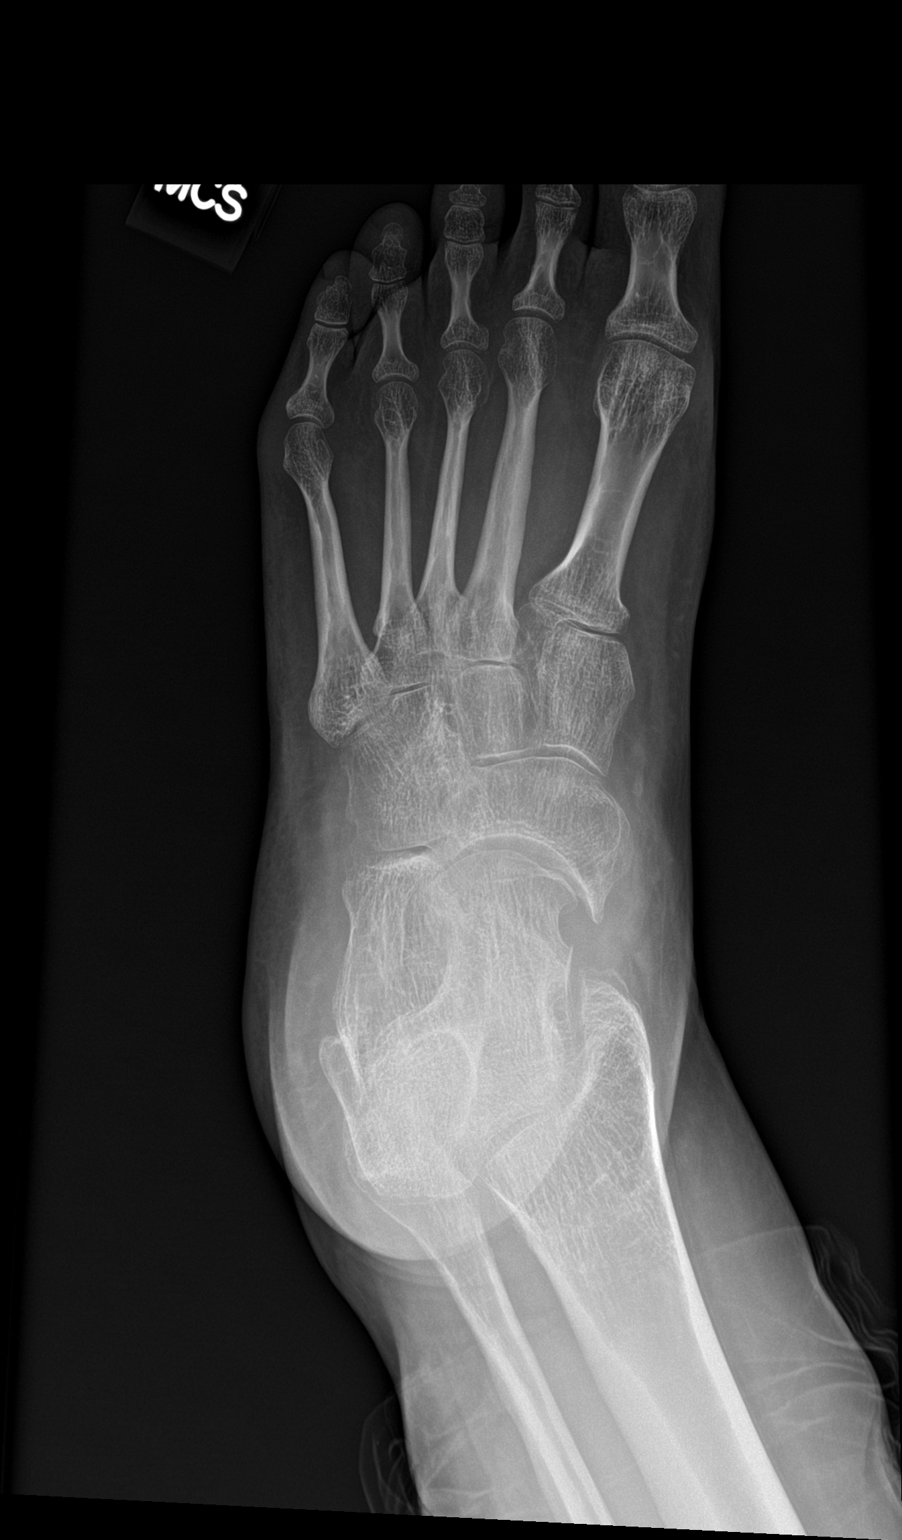

[foot obl]
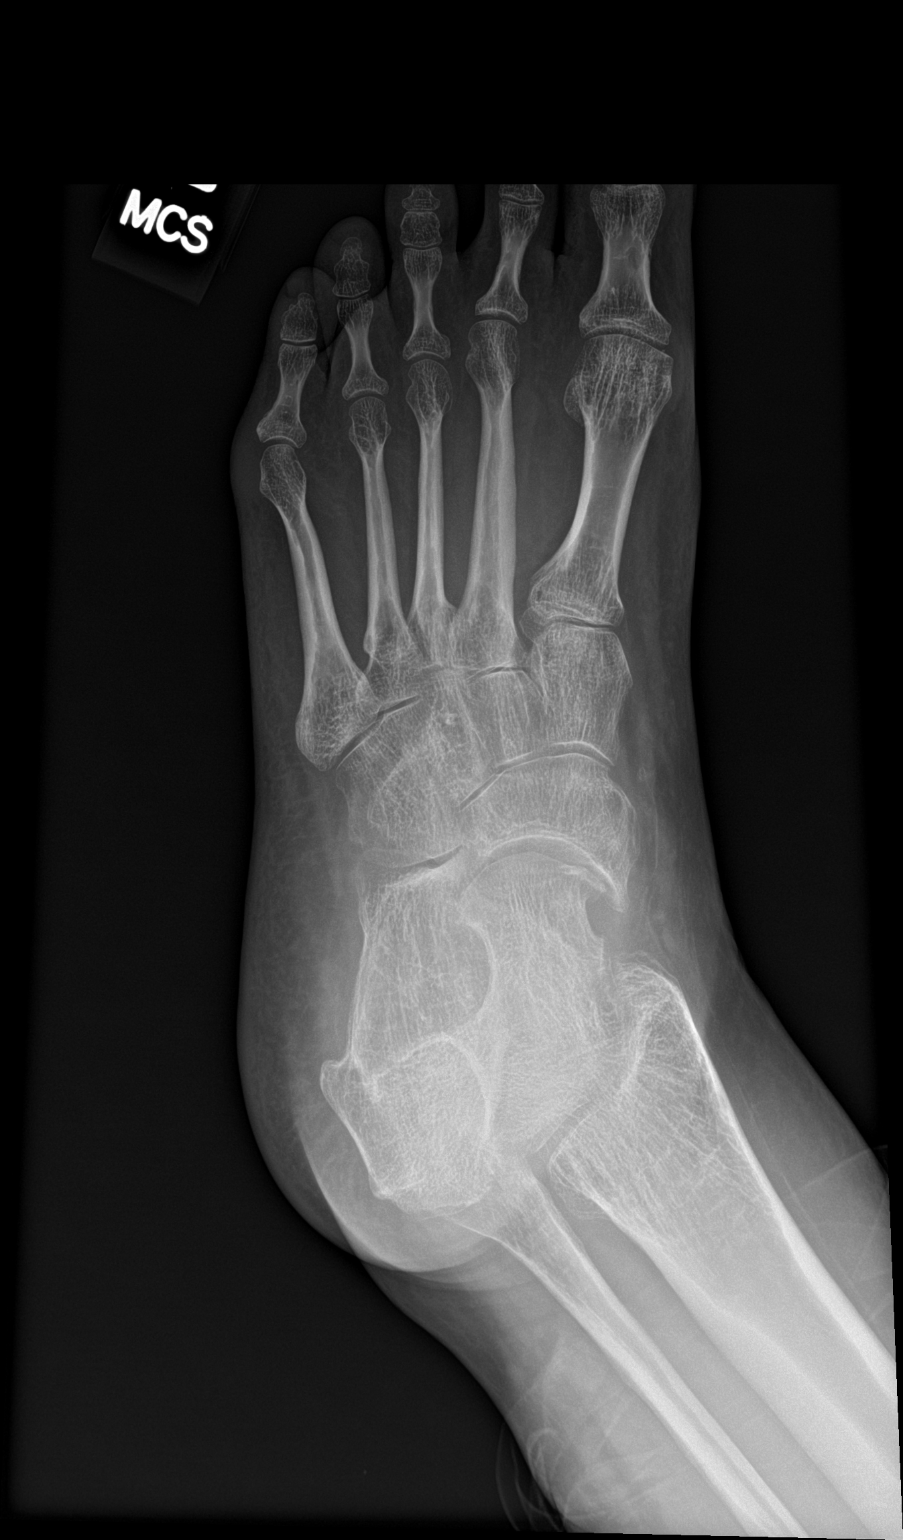

[foot lat]
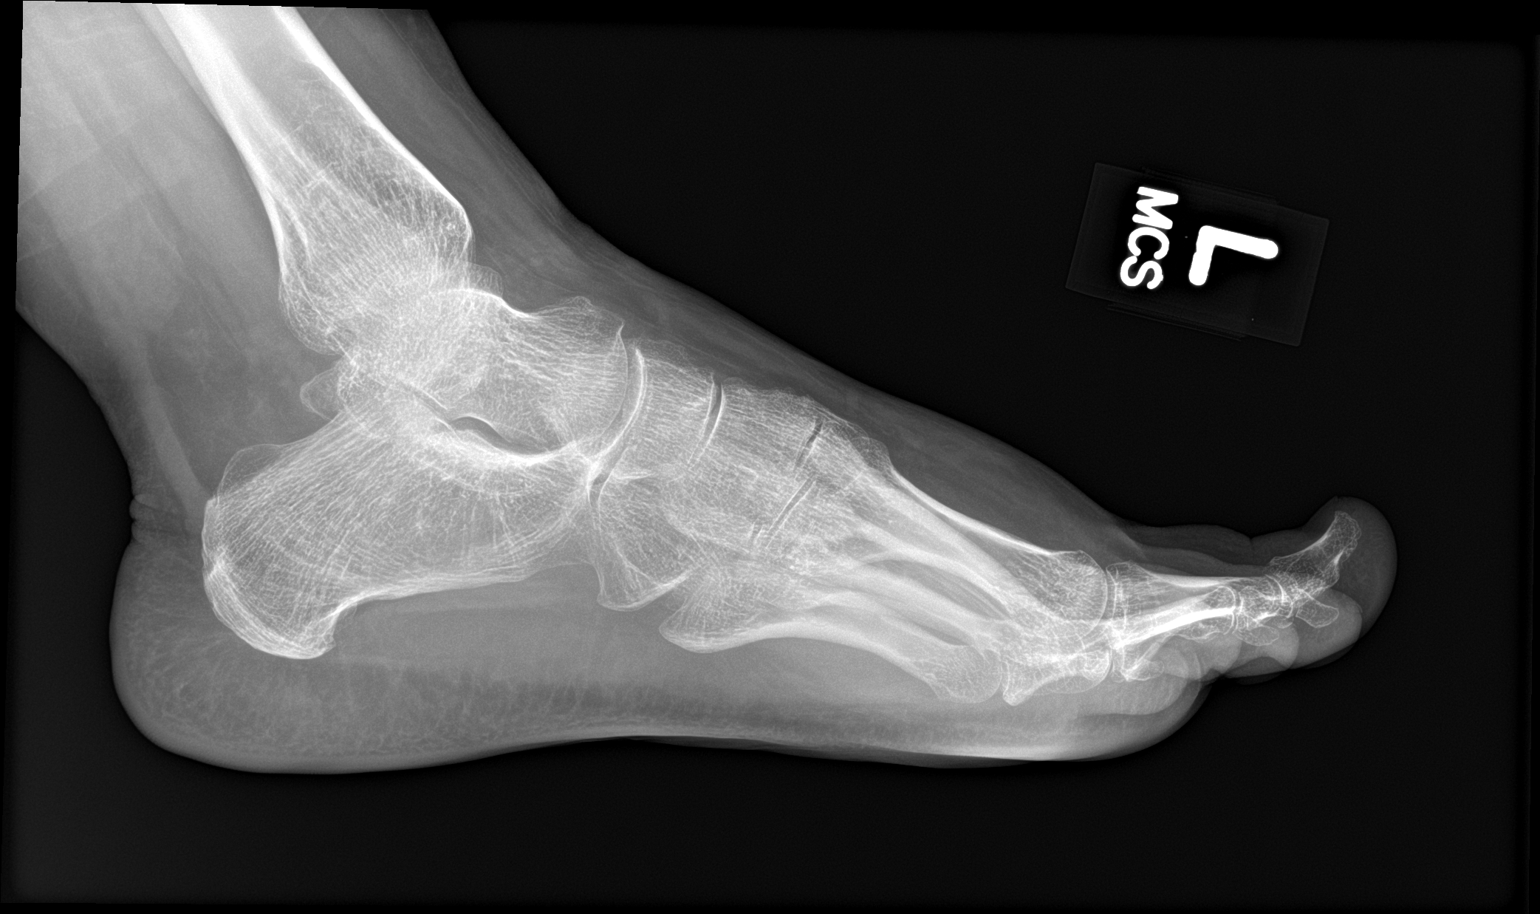

[3 of 3 positions shown; findings below may reference images not displayed]

FINDINGS: Mildly decreased bone mineralization. Minimal great toe
metatarsophalangeal joint space narrowing. Moderate medial and
dorsal talonavicular joint space narrowing and peripheral
degenerative spurring, similar to prior. No acute fracture or
dislocation.
IMPRESSION: Moderate medial talonavicular osteoarthritis.  No acute fracture.

## 2024-03-14 ENCOUNTER — Emergency Department (HOSPITAL_COMMUNITY)
Admission: EM | Admit: 2024-03-14 | Discharge: 2024-03-14 | Disposition: A | Discharge status: Home / Self Care | Payer: Self-pay | Attending: Emergency Medicine | Admitting: Emergency Medicine

## 2024-03-14 ENCOUNTER — Other Ambulatory Visit: Payer: Self-pay

## 2024-03-14 ENCOUNTER — Emergency Department (HOSPITAL_COMMUNITY): Payer: Self-pay

## 2024-03-14 DIAGNOSIS — X58XXXA Exposure to other specified factors, initial encounter: Secondary | ICD-10-CM | POA: Insufficient documentation

## 2024-03-14 DIAGNOSIS — S93402A Sprain of unspecified ligament of left ankle, initial encounter: Secondary | ICD-10-CM | POA: Insufficient documentation

## 2024-03-14 MED ORDER — ACETAMINOPHEN 325 MG PO TABS
650.0000 mg | ORAL_TABLET | Freq: Once | ORAL | Status: AC
  Administered 2024-03-14: 650 mg via ORAL
  Filled 2024-03-14: qty 2

## 2024-03-14 NOTE — ED Provider Notes (Signed)
 [place] EMERGENCY DEPARTMENT AT [facility] Provider Note   CSN: [id number] Arrival date & time: 03/14/24  1015     Patient presents with: Ankle Pain (Left ankle)   Sharon Perkins is a 33 y.o. female with PMHx osteogenesis imperfecta who presents to ED concern for left ankle pain.  Patient stating that her ankles have been mildly swollen for at least 1 year.  Patient stating that swelling gets worse if she does not rest appropriately after working her 2 jobs.  Patient states that his left ankle has been more swollen for around 2 weeks.  Patient stating that sometimes she her leg twitches and contracts after working long shifts and believes that she injured her ankle during one of these episodes. 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   Patient denies infectious symptoms such as fever, chest pain, SOB, nausea, vomiting, diarrhea.  Patient has not taken anything for pain this morning.  Patient is able to ambulate on this foot.    Ankle Pain      Prior to Admission medications   Medication Sig Start Date End Date Taking? Authorizing Provider  acetaminophen  (TYLENOL ) 325 MG tablet Take 2 tablets (650 mg total) by mouth every 6 (six) hours as needed. 12/16/21   Elnor Jayson LABOR, DO  HYDROcodone -acetaminophen  (NORCO/VICODIN) 5-325 MG tablet Take 1-2 tablets by mouth every 4 (four) hours as needed. 01/06/17   Muthersbaugh, Chiquita, PA-C  ibuprofen  (ADVIL ) 600 MG tablet Take 1 tablet (600 mg total) by mouth every 6 (six) hours as needed. 12/16/21   Elnor Jayson A, DO  methocarbamol  (ROBAXIN ) 500 MG tablet Take 1 tablet (500 mg total) by mouth 2 (two) times daily. 04/17/21   Charlyn Sora, MD  naproxen  (NAPROSYN ) 500 MG tablet Take 1 tablet (500 mg total) by mouth 2 (two) times daily. 04/17/21   Charlyn Sora, MD    Allergies: Ampicillin    Review of Systems  Musculoskeletal:        Ankle pain    Updated Vital  Signs BP (!) 153/99 (BP Location: Left Arm)   Pulse 89   Temp 98.2 F (36.8 C)   Resp 17   Ht 5' 2 (1.575 m)   Wt 104.3 kg   SpO2 100%   BMI 42.07 kg/m   Physical Exam Vitals and nursing note reviewed.  Constitutional:      General: She is not in acute distress.    Appearance: She is not ill-appearing or toxic-appearing.  HENT:     Head: Normocephalic and atraumatic.  Eyes:     General: No scleral icterus.       Right eye: No discharge.        Left eye: No discharge.     Conjunctiva/sclera: Conjunctivae normal.  Cardiovascular:     Rate and Rhythm: Normal rate.  Pulmonary:     Effort: Pulmonary effort is normal.  Abdominal:     General: Abdomen is flat.  Musculoskeletal:     Comments: Moderate swelling posterior to lateral malleolus without bruising, erythema, increased warmth or fluctuance.  +2 pedal pulses.  Sensation light touch intact.  ROM intact but painful.  Area nontense.  Skin:    General: Skin is warm and dry.  Neurological:     General: No focal deficit present.     Mental Status: She is alert. Mental status is at baseline.  Psychiatric:        Mood and  Affect: Mood normal.        Behavior: Behavior normal.     (all labs ordered are listed, but only abnormal results are displayed) Labs Reviewed - No data to display  EKG: None  Radiology: DG Ankle 2 Views Left Result Date: 03/14/2024 CLINICAL DATA:  Ankle pain for a year. EXAM: LEFT ANKLE - 2 VIEW COMPARISON:  X-ray 01/27/2021 FINDINGS: Osteopenia. No acute fracture or dislocation. Mild joint space loss about the ankle but similar to previous. There is some beaking of the anterior superior process of the calcaneus. Please correlate for any history of a partial coalition. IMPRESSION: Chronic changes.  No acute osseous abnormality.  Osteopenia Electronically Signed   By: Ranell Bring M.D.   On: 03/14/2024 10:44     Procedures   Medications Ordered in the ED  acetaminophen  (TYLENOL ) tablet 650 mg (has  no administration in time range)                                    Medical Decision Making Amount and/or Complexity of Data Reviewed Radiology: ordered.  Risk OTC drugs.   This patient presents to the ED for concern of ankle pain, this involves an extensive number of treatment options, and is a complaint that carries with it a high risk of complications and morbidity.  The differential diagnosis includes hemarthrosis, gout, septic joint, fracture, tendonitis, muscle strain, bursitis, compartment syndrome   Co morbidities that complicate the patient evaluation  Osteogenesis imperfecta   Additional history obtained:  No PCP listed in chart   Problem List / ED Course / Critical interventions / Medication management  Patient presents to ED concern for left ankle pain.  Physical exam with moderate swelling behind the lateral malleolus.  Rest of physical exam reassuring.  Patient afebrile with stable vitals. I ordered imaging studies including left ankle x-ray. I independently visualized and interpreted imaging. I agree with the radiologist interpretation of no acute process.  Shared results with patient.  Answered all her questions.  Provided patient with Tylenol  and an ankle brace.  Recommended following up with Ortho.  Patient verbalized understanding of plan. I have reviewed the patients home medicines and have made adjustments as needed The patient has been appropriately medically screened and/or stabilized in the ED. I have low suspicion for any other emergent medical condition which would require further screening, evaluation or treatment in the ED or require inpatient management. At time of discharge the patient is hemodynamically stable and in no acute distress. I have discussed work-up results and diagnosis with patient and answered all questions. Patient is agreeable with discharge plan. We discussed strict return precautions for returning to the emergency department and they  verbalized understanding.     Ddx these are considered less likely due to history of present illness and physical exam: -gout: no warmth or erythema; ROM intact  -septic joint: afebrile; no warmth or erythema; no skin changes; ROM intact  -fracture: xray without concern  -compartment syndrome: area not tense; neurovascularly intact   Social Determinants of Health:  none      Final diagnoses:  Sprain of left ankle, unspecified ligament, initial encounter    ED Discharge Orders     None          Hoy Nidia FALCON, [place] 03/14/24 1105    Dasie Faden, MD 03/14/24 1430

## 2024-03-14 NOTE — Discharge Instructions (Signed)
 You will need to follow-up with orthopedic care.  Seek emergency care if experiencing any new or worsening symptoms.  Alternating between 650 mg Tylenol  and 400 mg Advil : The best way to alternate taking Acetaminophen  (example Tylenol ) and Ibuprofen  (example Advil /Motrin ) is to take them 3 hours apart. For example, if you take ibuprofen  at 6 am you can then take Tylenol  at 9 am. You can continue this regimen throughout the day, making sure you do not exceed the recommended maximum dose for each drug.

## 2024-03-14 NOTE — ED Triage Notes (Signed)
 Pt presents to the ED for left ankle pain that she has had for 1 year. [place] then and nothing showed in the scans. Pt states that the pain has never gone away and has not been to a provider since the injury. Pt treats the pain at home with tylenol  and ibuprofen . Pt is able to ambulate, but has constant pain.

## 2024-03-14 NOTE — Progress Notes (Signed)
 Orthopedic Tech Progress Note Patient Details:  Sharon Perkins 07-02-1991 [id number]  Ortho Devices Type of Ortho Device: ASO Ortho Device/Splint Location: LLE Ortho Device/Splint Interventions: Ordered, Application, Adjustment   Post Interventions Patient Tolerated: Well Instructions Provided: Adjustment of device  Adine MARLA Blush 03/14/2024, 11:29 AM

## 2024-07-27 ENCOUNTER — Ambulatory Visit
Admission: EM | Admit: 2024-07-27 | Discharge: 2024-07-27 | Disposition: A | Discharge status: Home / Self Care | Payer: Self-pay

## 2024-07-27 DIAGNOSIS — R300 Dysuria: Secondary | ICD-10-CM

## 2024-07-27 DIAGNOSIS — R110 Nausea: Secondary | ICD-10-CM | POA: Insufficient documentation

## 2024-07-27 DIAGNOSIS — N3001 Acute cystitis with hematuria: Secondary | ICD-10-CM | POA: Insufficient documentation

## 2024-07-27 LAB — POCT URINE DIPSTICK
Bilirubin, UA: NEGATIVE
Glucose, UA: NEGATIVE mg/dL
Ketones, POC UA: NEGATIVE mg/dL
Nitrite, UA: NEGATIVE
POC PROTEIN,UA: 30 — AB
Spec Grav, UA: 1.025 (ref 1.010–1.025)
Urobilinogen, UA: 0.2 U/dL
pH, UA: 6 (ref 5.0–8.0)

## 2024-07-27 MED ORDER — ONDANSETRON 4 MG PO TBDP
4.0000 mg | ORAL_TABLET | Freq: Once | ORAL | Status: AC
  Administered 2024-07-27: 4 mg via ORAL

## 2024-07-27 MED ORDER — SULFAMETHOXAZOLE-TRIMETHOPRIM 800-160 MG PO TABS: 1.0000 | ORAL_TABLET | Freq: Two times a day (BID) | ORAL | 0 refills | Status: AC

## 2024-07-27 NOTE — ED Triage Notes (Signed)
 Patient reports not feeling well since yesterday with abd pain, upset with now vomiting today. Also noticing burning with urination. No fever. Stools (normal).

## 2024-07-27 NOTE — ED Provider Notes (Signed)
 [facility]    CSN: [id number] Arrival date & time: 07/27/24  1211      History   Chief Complaint Chief Complaint  Patient presents with   Emesis   UTI Symptoms    HPI Sharon Perkins is a 33 y.o. female.   Patient presents with nausea without vomiting, lower abdominal discomfort, dysuria that started yesterday.  Denies fever, back pain, chills.  Reports normal bowel movements with no blood in stool.  She has been able to tolerate food and fluids.  Denies history of frequent urinary tract infections.  Last menstrual cycle was approximately 3 months ago but patient reports this is baseline.  She is sexually active but adamantly denies concern for pregnancy given that she saw her gynecologist last week and had a negative pregnancy test.   Emesis   Past Medical History:  Diagnosis Date   Osteogenesis imperfecta     Patient Active Problem List   Diagnosis Date Noted   Postpartum examination following cesarean delivery 09/06/2011   General counseling and advice for contraceptive management 09/06/2011   Pregnancy with adoption planned 07/24/2011    History reviewed. No pertinent surgical history.  OB History     Gravida  2   Para      Term      Preterm      AB  1   Living         SAB  1   IAB      Ectopic      Multiple      Live Births               Home Medications    Prior to Admission medications   Medication Sig Start Date End Date Taking? Authorizing Provider  norgestimate-ethinyl estradiol (ORTHO-CYCLEN) 0.25-35 MG-MCG tablet Take 1 tablet by mouth daily. 09/06/11  Yes [provider]  Pseudoephedrine-APAP-DM (DAYQUIL PO) Take by mouth.   Yes [provider]  sulfamethoxazole-trimethoprim (BACTRIM DS) 800-160 MG tablet Take 1 tablet by mouth 2 (two) times daily for 3 days. 07/27/24 07/30/24 Yes Breyah Akhter, Darryle BRAVO, FNP  acetaminophen  (TYLENOL ) 325 MG tablet Take 2 tablets (650 mg total) by mouth every 6 (six) hours as  needed. 12/16/21   Elnor Jayson LABOR, DO  HYDROcodone -acetaminophen  (NORCO/VICODIN) 5-325 MG tablet Take 1-2 tablets by mouth every 4 (four) hours as needed. 01/06/17   Muthersbaugh, Chiquita, PA-C  ibuprofen  (ADVIL ) 600 MG tablet Take 1 tablet (600 mg total) by mouth every 6 (six) hours as needed. 12/16/21   Elnor Jayson LABOR, DO  methocarbamol  (ROBAXIN ) 500 MG tablet Take 1 tablet (500 mg total) by mouth 2 (two) times daily. 04/17/21   Charlyn Sora, MD  naproxen  (NAPROSYN ) 500 MG tablet Take 1 tablet (500 mg total) by mouth 2 (two) times daily. 04/17/21   Charlyn Sora, MD    Family History History reviewed. No pertinent family history.  Social History Social History   Tobacco Use   Smoking status: Never   Smokeless tobacco: Never  Vaping Use   Vaping status: Never Used  Substance Use Topics   Alcohol use: Not Currently   Drug use: Never     Allergies   Codeine, Nafcillin, Ampicillin, and Tuberculin ppd   Review of Systems Review of Systems Per HPI  Physical Exam Triage Vital Signs ED Triage Vitals  Encounter Vitals Group     BP 07/27/24 1345 (S) (!) 133/100     Girls Systolic BP Percentile --  Girls Diastolic BP Percentile --      Boys Systolic BP Percentile --      Boys Diastolic BP Percentile --      Pulse Rate 07/27/24 1345 88     Resp 07/27/24 1345 20     Temp 07/27/24 1345 97.9 F (36.6 C)     Temp Source 07/27/24 1345 Oral     SpO2 07/27/24 1345 98 %     Weight 07/27/24 1343 (!) 320 lb (145.2 kg)     Height 07/27/24 1343 5' 2 (1.575 m)     Head Circumference --      Peak Flow --      Pain Score 07/27/24 1340 2     Pain Loc --      Pain Education --      Exclude from Growth Chart --    No data found.  Updated Vital Signs BP (S) (!) 133/100 (BP Location: Left Arm)   Pulse 88   Temp 97.9 F (36.6 C) (Oral)   Resp 20   Ht 5' 2 (1.575 m)   Wt (!) 320 lb (145.2 kg)   LMP  (LMP Unknown)   SpO2 98%   BMI 58.53 kg/m   Visual Acuity Right Eye  Distance:   Left Eye Distance:   Bilateral Distance:    Right Eye Near:   Left Eye Near:    Bilateral Near:     Physical Exam Constitutional:      General: She is not in acute distress.    Appearance: Normal appearance. She is not toxic-appearing or diaphoretic.  HENT:     Head: Normocephalic and atraumatic.     Mouth/Throat:     Mouth: Mucous membranes are moist.     Pharynx: No posterior oropharyngeal erythema.  Eyes:     Extraocular Movements: Extraocular movements intact.     Conjunctiva/sclera: Conjunctivae normal.  Cardiovascular:     Rate and Rhythm: Normal rate and regular rhythm.     Pulses: Normal pulses.     Heart sounds: Normal heart sounds.  Pulmonary:     Effort: Pulmonary effort is normal. No respiratory distress.     Breath sounds: Normal breath sounds.  Abdominal:     General: Bowel sounds are normal. There is no distension.     Palpations: Abdomen is soft.     Tenderness: There is no abdominal tenderness.  Neurological:     General: No focal deficit present.     Mental Status: She is alert and oriented to person, place, and time. Mental status is at baseline.  Psychiatric:        Mood and Affect: Mood normal.        Behavior: Behavior normal.        Thought Content: Thought content normal.        Judgment: Judgment normal.      UC Treatments / Results  Labs (all labs ordered are listed, but only abnormal results are displayed) Labs Reviewed  POCT URINE DIPSTICK - Abnormal; Notable for the following components:      Result Value   Clarity, UA cloudy (*)    Blood, UA moderate (*)    POC PROTEIN,UA =30 (*)    Leukocytes, UA Small (1+) (*)    All other components within normal limits  URINE CULTURE    EKG   Radiology No results found.  Procedures Procedures (including critical care time)  Medications Ordered in UC Medications  ondansetron (ZOFRAN-ODT) disintegrating tablet 4 mg (  4 mg Oral Given 07/27/24 1348)    Initial Impression /  Assessment and Plan / UC Course  I have reviewed the triage vital signs and the nursing notes.  Pertinent labs & imaging results that were available during my care of the patient were reviewed by me and considered in my medical decision making (see chart for details).     UA is showing moderate leukocytes which is concerning for urinary tract infection.  Nausea is most likely due to this.  Patient does not appear to be in need of emergency department evaluation at this time.  Will treat with Bactrim.  Urine culture pending.  Advised patient to ensure she is drinking plenty of water as well.  Ondansetron administered in urgent care with some improvement in nausea.  Blood pressure slightly elevated so encouraged patient to monitor this at home and follow-up with urgent care or PCP if it remains elevated.  Advised strict follow-up precautions.  Patient verbalized understanding and was agreeable with plan. Final Clinical Impressions(s) / UC Diagnoses   Final diagnoses:  Acute cystitis with hematuria  Dysuria  Nausea without vomiting     Discharge Instructions      Antibiotic has been prescribed for concern of urinary tract infection.  Urine culture is pending.  We will call with results.  Please monitor your blood pressure at home.  Follow-up if any symptoms persist or worsen.    ED Prescriptions     Medication Sig Dispense Auth. Provider   sulfamethoxazole-trimethoprim (BACTRIM DS) 800-160 MG tablet Take 1 tablet by mouth 2 (two) times daily for 3 days. 6 tablet [place], [place] Love[place]chio E, [place]      PDMP not reviewed this encounter.   Hazen Darryle BRAVO, [place] 07/27/24 1440

## 2024-07-27 NOTE — Discharge Instructions (Signed)
 Antibiotic has been prescribed for concern of urinary tract infection.  Urine culture is pending.  We will call with results.  Please monitor your blood pressure at home.  Follow-up if any symptoms persist or worsen.

## 2024-07-28 LAB — URINE CULTURE

## 2024-07-29 ENCOUNTER — Ambulatory Visit (HOSPITAL_COMMUNITY): Payer: Self-pay
